# Patient Record
Sex: Female | Born: 1965 | Race: White | Hispanic: No | Marital: Single | Smoking: Former smoker
Health system: Southern US, Community
[De-identification: ages and names within clinical notes are randomized; demographics above are authoritative.]

## PROBLEM LIST (undated history)

## (undated) DIAGNOSIS — O24419 Gestational diabetes mellitus in pregnancy, unspecified control: Secondary | ICD-10-CM

## (undated) DIAGNOSIS — B019 Varicella without complication: Secondary | ICD-10-CM

## (undated) DIAGNOSIS — K219 Gastro-esophageal reflux disease without esophagitis: Secondary | ICD-10-CM

## (undated) HISTORY — DX: Gastro-esophageal reflux disease without esophagitis: K21.9

## (undated) HISTORY — DX: Varicella without complication: B01.9

## (undated) HISTORY — DX: Gestational diabetes mellitus in pregnancy, unspecified control: O24.419

---
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
## 2006-12-11 DIAGNOSIS — O24419 Gestational diabetes mellitus in pregnancy, unspecified control: Secondary | ICD-10-CM

## 2006-12-11 HISTORY — DX: Gestational diabetes mellitus in pregnancy, unspecified control: O24.419

## 2010-12-11 HISTORY — PX: TUBAL LIGATION: SHX77

## 2010-12-11 HISTORY — PX: LAPAROSCOPIC GASTRIC SLEEVE RESECTION: SHX5895

## 2013-10-29 ENCOUNTER — Telehealth: Payer: Self-pay | Admitting: Family Medicine

## 2013-10-29 NOTE — Telephone Encounter (Signed)
Pt is new to the area and is trying to est w/you as her PCP.  The first apptmt for new patient I could find is July 09, 2014.  Is this correct? Pt says she has no medical problems but would like to be established prior to that. If July 30th is correct, could you accommodate her a sooner appmt? Thank you.

## 2013-10-30 NOTE — Telephone Encounter (Signed)
I don't know if that is correct. I can accommodate her sooner... Add her in on a day when no other new pt is here for 30 min.

## 2013-11-12 NOTE — Telephone Encounter (Signed)
Pt scheduled for 12/12/2013

## 2013-12-12 ENCOUNTER — Ambulatory Visit (INDEPENDENT_AMBULATORY_CARE_PROVIDER_SITE_OTHER): Payer: BC Managed Care – PPO | Admitting: Family Medicine

## 2013-12-12 ENCOUNTER — Encounter: Payer: Self-pay | Admitting: Family Medicine

## 2013-12-12 VITALS — BP 126/70 | HR 80 | Temp 98.8°F | Ht 66.5 in | Wt 186.0 lb

## 2013-12-12 DIAGNOSIS — H9190 Unspecified hearing loss, unspecified ear: Secondary | ICD-10-CM | POA: Insufficient documentation

## 2013-12-12 DIAGNOSIS — N951 Menopausal and female climacteric states: Secondary | ICD-10-CM | POA: Insufficient documentation

## 2013-12-12 DIAGNOSIS — H9193 Unspecified hearing loss, bilateral: Secondary | ICD-10-CM

## 2013-12-12 DIAGNOSIS — K219 Gastro-esophageal reflux disease without esophagitis: Secondary | ICD-10-CM | POA: Insufficient documentation

## 2013-12-12 NOTE — Progress Notes (Signed)
   Subjective:    Patient ID: Elizabeth Doyle, female    DOB: [birth year], 48 y.o.   MRN: [id number]  HPI 48 year old female presents to establish care. Moved from [place] since 05/2013. Last saw PCP in [place] within the last year for CPX. OB GYN... 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  She has started having hot flashes, not lifes changing. Last menses was 3 months ago.  Diet:  Good, small meals after ggastric sleeve. Exercise: plans to restart ith trainer.     Review of Systems  Constitutional: Negative for fever, fatigue and unexpected weight change.  HENT: Positive for congestion. Negative for ear pain, sinus pressure, sneezing, sore throat and trouble swallowing.   Eyes: Negative for pain and itching.  Respiratory: Negative for cough, shortness of breath and wheezing.   Cardiovascular: Negative for chest pain, palpitations and leg swelling.  Gastrointestinal: Negative for nausea, abdominal pain, diarrhea, constipation and blood in stool.  Genitourinary: Negative for dysuria, hematuria, vaginal discharge, difficulty urinating and menstrual problem.  Skin: Negative for rash.  Neurological: Negative for syncope, weakness, light-headedness, numbness and headaches.  Psychiatric/Behavioral: Negative for confusion and dysphoric mood. The patient is not nervous/anxious.        Objective:   Physical Exam  Constitutional: Vital signs are normal. She appears well-developed and well-nourished. She is cooperative.  Non-toxic appearance. She does not appear ill. No distress.  HENT:  Head: Normocephalic.  Right Ear: Hearing, tympanic membrane, external ear and ear canal normal.  Left Ear: Hearing, tympanic membrane, external ear and ear canal normal.  Nose: Nose normal.  Eyes: Conjunctivae, EOM and lids are normal. Pupils  are equal, round, and reactive to light. Lids are everted and swept, no foreign bodies found.  Neck: Trachea normal and normal range of motion. Neck supple. Carotid bruit is not present. No mass and no thyromegaly present.  Cardiovascular: Normal rate, regular rhythm, S1 normal, S2 normal, normal heart sounds and intact distal pulses.  Exam reveals no gallop.   No murmur heard. Pulmonary/Chest: Effort normal and breath sounds normal. No respiratory distress. She has no wheezes. She has no rhonchi. She has no rales.  Abdominal: Soft. Normal appearance and bowel sounds are normal. She exhibits no distension, no fluid wave, no abdominal bruit and no mass. There is no hepatosplenomegaly. There is no tenderness. There is no rebound, no guarding and no CVA tenderness. No hernia.  Lymphadenopathy:    She has no cervical adenopathy.    She has no axillary adenopathy.  Neurological: She is alert. She has normal strength. No cranial nerve deficit or sensory deficit.  Skin: Skin is warm, dry and intact. No rash noted.  Psychiatric: Her speech is normal and behavior is normal. Judgment normal. Her mood appears not anxious. Cognition and memory are normal. She does not exhibit a depressed mood.          Assessment & Plan:

## 2013-12-12 NOTE — Assessment & Plan Note (Signed)
Well controlled on prilosec when she uses .[facility] Kitchen. She will start daily low dose. now that she knows she can.

## 2013-12-12 NOTE — Patient Instructions (Addendum)
Schedule you CPX  in 2-3 months with fasting labs prior. Schedule mammogram on your own.  Can try trial of black cohosh for menopausal symptoms.

## 2013-12-12 NOTE — Assessment & Plan Note (Signed)
Discussed options for treatment. She will first start with trial of black cohosh.

## 2013-12-18 ENCOUNTER — Other Ambulatory Visit: Payer: Self-pay | Admitting: Family Medicine

## 2013-12-18 ENCOUNTER — Other Ambulatory Visit: Payer: Self-pay

## 2013-12-18 DIAGNOSIS — Z1231 Encounter for screening mammogram for malignant neoplasm of breast: Secondary | ICD-10-CM

## 2013-12-18 DIAGNOSIS — N63 Unspecified lump in unspecified breast: Secondary | ICD-10-CM

## 2013-12-23 ENCOUNTER — Other Ambulatory Visit: Payer: Self-pay

## 2013-12-23 ENCOUNTER — Other Ambulatory Visit: Payer: Self-pay | Admitting: Family Medicine

## 2013-12-23 DIAGNOSIS — N63 Unspecified lump in unspecified breast: Secondary | ICD-10-CM

## 2013-12-26 ENCOUNTER — Ambulatory Visit
Admission: RE | Admit: 2013-12-26 | Discharge: 2013-12-26 | Disposition: A | Discharge status: Home / Self Care | Payer: BC Managed Care – PPO | Source: Ambulatory Visit | Attending: Family Medicine | Admitting: Family Medicine

## 2013-12-26 DIAGNOSIS — N63 Unspecified lump in unspecified breast: Secondary | ICD-10-CM

## 2014-03-01 ENCOUNTER — Telehealth: Payer: Self-pay | Admitting: Family Medicine

## 2014-03-01 DIAGNOSIS — Z13220 Encounter for screening for lipoid disorders: Secondary | ICD-10-CM

## 2014-03-01 NOTE — Telephone Encounter (Signed)
Message copied by Excell SeltzerBEDSOLE, AMY E on Sun Mar 01, 2014 10:57 PM ------      Message from: Mills KollerWALSH, TERRI J      Created: Mon Feb 23, 2014 10:06 AM      Regarding: Lab orders for Monday, 3.23.15       Patient is scheduled for CPX labs, please order future labs, Thanks , Terri       ------

## 2014-03-02 ENCOUNTER — Other Ambulatory Visit: Payer: BC Managed Care – PPO

## 2014-03-06 ENCOUNTER — Encounter: Payer: BC Managed Care – PPO | Admitting: Family Medicine

## 2014-05-01 ENCOUNTER — Other Ambulatory Visit: Payer: BC Managed Care – PPO

## 2014-05-08 ENCOUNTER — Encounter: Payer: BC Managed Care – PPO | Admitting: Family Medicine

## 2014-07-12 ENCOUNTER — Telehealth: Payer: Self-pay | Admitting: Family Medicine

## 2014-07-12 DIAGNOSIS — Z13220 Encounter for screening for lipoid disorders: Secondary | ICD-10-CM

## 2014-07-12 NOTE — Telephone Encounter (Signed)
Message copied by Excell SeltzerBEDSOLE, Khali Albanese E on Sun Jul 12, 2014 10:59 PM ------      Message from: Alvina ChouWALSH, TERRI J      Created: Tue Jul 07, 2014 12:19 PM      Regarding: Lab orders for Monday,8.3.15        Patient is scheduled for CPX labs, please order future labs, Thanks , Terri            cmp ordered for future, lipid is too, but has expired ------

## 2014-07-13 ENCOUNTER — Other Ambulatory Visit (INDEPENDENT_AMBULATORY_CARE_PROVIDER_SITE_OTHER): Payer: BC Managed Care – PPO

## 2014-07-13 DIAGNOSIS — Z13220 Encounter for screening for lipoid disorders: Secondary | ICD-10-CM

## 2014-07-13 LAB — COMPREHENSIVE METABOLIC PANEL
ALT: 19 U/L (ref 0–35)
AST: 22 U/L (ref 0–37)
Albumin: 3.5 g/dL (ref 3.5–5.2)
Alkaline Phosphatase: 56 U/L (ref 39–117)
BILIRUBIN TOTAL: 0.4 mg/dL (ref 0.2–1.2)
BUN: 12 mg/dL (ref 6–23)
CALCIUM: 8.6 mg/dL (ref 8.4–10.5)
CHLORIDE: 105 meq/L (ref 96–112)
CO2: 29 mEq/L (ref 19–32)
Creatinine, Ser: 0.6 mg/dL (ref 0.4–1.2)
GFR: 106.56 mL/min (ref >60.00)
Glucose, Bld: 95 mg/dL (ref 70–99)
Potassium: 4.1 mEq/L (ref 3.5–5.1)
Sodium: 138 mEq/L (ref 135–145)
Total Protein: 6.4 g/dL (ref 6.0–8.3)

## 2014-07-13 LAB — LIPID PANEL
CHOL/HDL RATIO: 3
Cholesterol: 191 mg/dL (ref 0–200)
HDL: 61.7 mg/dL (ref >39.00)
LDL CALC: 109 mg/dL — AB (ref 0–99)
NONHDL: 129.3
TRIGLYCERIDES: 103 mg/dL (ref 0.0–149.0)
VLDL: 20.6 mg/dL (ref 0.0–40.0)

## 2014-07-27 ENCOUNTER — Telehealth: Payer: Self-pay | Admitting: Family Medicine

## 2014-07-27 NOTE — Telephone Encounter (Signed)
Patient was scheduled to have her physical tomorrow, but she was called out of town for work.  Patient rescheduled her appointment to your next available appointment on 10/13/14.  Patient already had her lab work done. She was disappointed that she has to wait so long to have her physical.  Can she be seen sooner or can she be notified of her lab results?

## 2014-07-28 ENCOUNTER — Encounter: Payer: BC Managed Care – PPO | Admitting: Family Medicine

## 2014-07-28 NOTE — Telephone Encounter (Signed)
Get her in sooner in any 30 min slot

## 2014-07-31 ENCOUNTER — Ambulatory Visit (INDEPENDENT_AMBULATORY_CARE_PROVIDER_SITE_OTHER): Payer: BC Managed Care – PPO | Admitting: Family Medicine

## 2014-07-31 ENCOUNTER — Other Ambulatory Visit (HOSPITAL_COMMUNITY)
Admission: RE | Admit: 2014-07-31 | Discharge: 2014-07-31 | Disposition: A | Discharge status: Home / Self Care | Payer: BC Managed Care – PPO | Source: Ambulatory Visit | Attending: Family Medicine | Admitting: Family Medicine

## 2014-07-31 ENCOUNTER — Encounter: Payer: Self-pay | Admitting: Family Medicine

## 2014-07-31 ENCOUNTER — Encounter (INDEPENDENT_AMBULATORY_CARE_PROVIDER_SITE_OTHER): Payer: Self-pay

## 2014-07-31 VITALS — BP 116/70 | HR 57 | Temp 97.8°F | Ht 66.61 in | Wt 207.5 lb

## 2014-07-31 DIAGNOSIS — Z23 Encounter for immunization: Secondary | ICD-10-CM

## 2014-07-31 DIAGNOSIS — Z1151 Encounter for screening for human papillomavirus (HPV): Secondary | ICD-10-CM | POA: Insufficient documentation

## 2014-07-31 DIAGNOSIS — K219 Gastro-esophageal reflux disease without esophagitis: Secondary | ICD-10-CM

## 2014-07-31 DIAGNOSIS — Z01419 Encounter for gynecological examination (general) (routine) without abnormal findings: Secondary | ICD-10-CM | POA: Insufficient documentation

## 2014-07-31 DIAGNOSIS — Z0000 Encounter for general adult medical examination without abnormal findings: Secondary | ICD-10-CM

## 2014-07-31 DIAGNOSIS — Z124 Encounter for screening for malignant neoplasm of cervix: Secondary | ICD-10-CM

## 2014-07-31 MED ORDER — PANTOPRAZOLE SODIUM 20 MG PO TBEC: 20.0000 mg | DELAYED_RELEASE_TABLET | Freq: Every day | ORAL | Status: DC

## 2014-07-31 NOTE — Progress Notes (Signed)
Subjective:    Patient ID: Elizabeth Doyle, female    DOB: March 04, 1966, 48 y.o.   MRN: [id number]  HPI  The patient is here for annual wellness exam and preventative care.    Lab Results  Component Value Date   CHOL 191 07/13/2014   HDL 61.70 07/13/2014   LDLCALC 109* 07/13/2014   TRIG 103.0 07/13/2014   CHOLHDL 3 07/13/2014   CMET nml as well.  Black cohosh has helped with hot flashes.  GERD, prilosec 20 mg has help if she she takes daily.  She has noted a lot of  weight gain.  She is status post gastric sleeve. In past SE to nexium.. Gas.  Wt Readings from Last 3 Encounters:  07/31/14 207 lb 8 oz (94.121 kg)  12/12/13 186 lb (84.369 kg)   She reports healthy eating. No exercise.  She travels a lot.    Review of Systems  Constitutional: Negative for fever, fatigue and unexpected weight change.  HENT: Negative for congestion, ear pain, sinus pressure, sneezing, sore throat and trouble swallowing.   Eyes: Negative for pain and itching.  Respiratory: Negative for cough, shortness of breath and wheezing.   Cardiovascular: Negative for chest pain, palpitations and leg swelling.  Gastrointestinal: Negative for nausea, abdominal pain, diarrhea, constipation and blood in stool.  Genitourinary: Negative for dysuria, hematuria, vaginal discharge, difficulty urinating and menstrual problem.  Skin: Negative for rash.  Neurological: Negative for syncope, weakness, light-headedness, numbness and headaches.  Psychiatric/Behavioral: Negative for confusion and dysphoric mood. The patient is not nervous/anxious.        Objective:   Physical Exam  Constitutional: Vital signs are normal. She appears well-developed and well-nourished. She is cooperative.  Non-toxic appearance. She does not appear ill. No distress.  HENT:  Head: Normocephalic.  Right Ear: Hearing, tympanic membrane, external ear and ear canal normal.  Left Ear: Hearing, tympanic membrane, external ear and ear canal normal.    Nose: Nose normal.  Eyes: Conjunctivae, EOM and lids are normal. Pupils are equal, round, and reactive to light. Lids are everted and swept, no foreign bodies found.  Neck: Trachea normal and normal range of motion. Neck supple. Carotid bruit is not present. No mass and no thyromegaly present.  Cardiovascular: Normal rate, regular rhythm, S1 normal, S2 normal, normal heart sounds and intact distal pulses.  Exam reveals no gallop.   No murmur heard. Pulmonary/Chest: Effort normal and breath sounds normal. No respiratory distress. She has no wheezes. She has no rhonchi. She has no rales.  Abdominal: Soft. Normal appearance and bowel sounds are normal. She exhibits no distension, no fluid wave, no abdominal bruit and no mass. There is no hepatosplenomegaly. There is no tenderness. There is no rebound, no guarding and no CVA tenderness. No hernia.  Genitourinary: Vagina normal and uterus normal. No breast swelling, tenderness, discharge or bleeding. Pelvic exam was performed with patient supine. There is no rash, tenderness or lesion on the right labia. There is no rash, tenderness or lesion on the left labia. Uterus is not enlarged and not tender. Cervix exhibits no motion tenderness, no discharge and no friability. Right adnexum displays no mass, no tenderness and no fullness. Left adnexum displays no mass, no tenderness and no fullness.  Lymphadenopathy:    She has no cervical adenopathy.    She has no axillary adenopathy.  Neurological: She is alert. She has normal strength. No cranial nerve deficit or sensory deficit.  Skin: Skin is warm, dry and intact. No rash  noted.  Psychiatric: Her speech is normal and behavior is normal. Judgment normal. Her mood appears not anxious. Cognition and memory are normal. She does not exhibit a depressed mood.          Assessment & Plan:  The patient's preventative maintenance and recommended screening tests for an annual wellness exam were reviewed in full  today. Brought up to date unless services declined.  Counselled on the importance of diet, exercise, and its role in overall health and mortality. The patient's FH and SH was reviewed, including their home life, tobacco status, and drug and alcohol status.   OB GYN in [place]... Last OV/pap smear 2013, nml paps smear history, due  now mammogram: stable cysts,12/2013. Colonoscopy 2012 nml...plan repeat in 10 years Nonsmoker  Vaccines: uptodate,  Will get flu vaccine today

## 2014-07-31 NOTE — Addendum Note (Signed)
Addended by: Damita LackLORING, Catrell Morrone S on: 07/31/2014 12:37 PM   Modules accepted: Orders

## 2014-07-31 NOTE — Assessment & Plan Note (Signed)
SE to prilosec but it helps a lot with GERD.  Stop prilosec and change to pantoprazole.

## 2014-07-31 NOTE — Patient Instructions (Addendum)
Work on regular exercise routine 3-5 times a week.  Work on [employer]healthy eating, increase water.  Stop prilosec and change to pantoprazole.        Gastroesophageal Reflux Disease, Adult Gastroesophageal reflux disease (GERD) happens when acid from your stomach flows up into the esophagus. When acid comes in contact with the esophagus, the acid causes soreness (inflammation) in the esophagus. Over time, GERD may create small holes (ulcers) in the lining of the esophagus. CAUSES   Increased body weight. This puts pressure on the stomach, making acid rise from the stomach into the esophagus.  Smoking. This increases acid production in the stomach.  Drinking alcohol. This causes decreased pressure in the lower esophageal sphincter (valve or ring of muscle between the esophagus and stomach), allowing acid from the stomach into the esophagus.  Late evening meals and a full stomach. This increases pressure and acid production in the stomach.  A malformed lower esophageal sphincter. Sometimes, no cause is found. SYMPTOMS   Burning pain in the lower part of the mid-chest behind the breastbone and in the mid-stomach area. This may occur twice a week or more often.  Trouble swallowing.  Sore throat.  Dry cough.  Asthma-like symptoms including chest tightness, shortness of breath, or wheezing. DIAGNOSIS  Your caregiver may be able to diagnose GERD based on your symptoms. In some cases, X-rays and other tests may be done to check for complications or to check the condition of your stomach and esophagus. TREATMENT  Your caregiver may recommend over-the-counter or prescription medicines to help decrease acid production. Ask your caregiver before starting or adding any new medicines.  HOME CARE INSTRUCTIONS   Change the factors that you can control. Ask your caregiver for guidance concerning weight loss, quitting smoking, and alcohol consumption.  Avoid foods and drinks that make your symptoms  worse, such as:  Caffeine or alcoholic drinks.  Chocolate.  Peppermint or mint flavorings.  Garlic and onions.  Spicy foods.  Citrus fruits, such as oranges, lemons, or limes.  Tomato-based foods such as sauce, chili, salsa, and pizza.  Fried and fatty foods.  Avoid lying down for the 3 hours prior to your bedtime or prior to taking a nap.  Eat small, frequent meals instead of large meals.  Wear loose-fitting clothing. Do not wear anything tight around your waist that causes pressure on your stomach.  Raise the head of your bed 6 to 8 inches with wood blocks to help you sleep. Extra pillows will not help.  Only take over-the-counter or prescription medicines for pain, discomfort, or fever as directed by your caregiver.  Do not take aspirin, ibuprofen, or other nonsteroidal anti-inflammatory drugs (NSAIDs). SEEK IMMEDIATE MEDICAL CARE IF:   You have pain in your arms, neck, jaw, teeth, or back.  Your pain increases or changes in intensity or duration.  You develop nausea, vomiting, or sweating (diaphoresis).  You develop shortness of breath, or you faint.  Your vomit is green, yellow, black, or looks like coffee grounds or blood.  Your stool is red, bloody, or black. These symptoms could be signs of other problems, such as heart disease, gastric bleeding, or esophageal bleeding. MAKE SURE YOU:   Understand these instructions.  Will watch your condition.  Will get help right away if you are not doing well or get worse. Document Released: 09/06/2005 Document Revised: 02/19/2012 Document Reviewed: 06/16/2011 [facility] Patient Information 2015 [place], [place]. This information is not intended to replace advice given to you by your health care  provider. Make sure you discuss any questions you have with your health care provider.

## 2014-07-31 NOTE — Progress Notes (Signed)
Pre visit review using our clinic review tool, if applicable. No additional management support is needed unless otherwise documented below in the visit note. 

## 2014-08-03 LAB — CYTOLOGY - PAP

## 2014-08-04 ENCOUNTER — Encounter: Payer: Self-pay | Admitting: *Deleted

## 2014-10-13 ENCOUNTER — Encounter: Payer: BC Managed Care – PPO | Admitting: Family Medicine

## 2014-11-17 ENCOUNTER — Ambulatory Visit (INDEPENDENT_AMBULATORY_CARE_PROVIDER_SITE_OTHER): Payer: BC Managed Care – PPO | Admitting: Internal Medicine

## 2014-11-17 ENCOUNTER — Encounter: Payer: Self-pay | Admitting: Internal Medicine

## 2014-11-17 VITALS — BP 122/78 | HR 71 | Temp 98.1°F | Wt 204.0 lb

## 2014-11-17 DIAGNOSIS — B9789 Other viral agents as the cause of diseases classified elsewhere: Principal | ICD-10-CM

## 2014-11-17 DIAGNOSIS — J069 Acute upper respiratory infection, unspecified: Secondary | ICD-10-CM

## 2014-11-17 MED ORDER — HYDROCODONE-HOMATROPINE 5-1.5 MG/5ML PO SYRP: 5.0000 mL | ORAL_SOLUTION | Freq: Three times a day (TID) | ORAL | Status: DC | PRN

## 2014-11-17 NOTE — Progress Notes (Signed)
HPI  Pt presents to the clinic today with c/o nasal congestion, cough and chest congestion. She reports this started 4 days ago. She is blowing light green mucous out of her nose. The cough is unproductive. She denies fever, chills or body aches. She has tried Robitussin with some relief. She reports that she is feeling a little better today. She has a history of seasonal allergies but denies breathing problems. She has not had sick contacts that she is aware of.  Review of Systems      Past Medical History  Diagnosis Date  . Chicken pox 1972  . GERD (gastroesophageal reflux disease)   . DM (diabetes mellitus), gestational 2008    Family History  Problem Relation Age of Onset  . Hypertension Mother   . Arthritis Mother     osteoarthrits, scoliosis  . Kidney disease Mother     secondary to NSAIDs, HTN  . Hypertension Father   . Diabetes Father   . Diabetes Paternal Uncle   . Heart disease Maternal Grandmother   . Stroke Maternal Grandmother   . Mental illness Paternal Grandmother   . Cancer Paternal Grandfather     History   Social History  . Marital Status: Single    Spouse Name: N/A    Number of Children: N/A  . Years of Education: N/A   Occupational History  . Not on file.   Social History Main Topics  . Smoking status: Former Smoker -- 0.25 packs/day for 4 years    Quit date: 12/12/1988  . Smokeless tobacco: Never Used  . Alcohol Use: 1.0 oz/week    2 drink(s) per week  . Drug Use: No  . Sexual Activity:    Partners: Male    Birth Control/ Protection: Surgical   Other Topics Concern  . Not on file   Social History Narrative   In longterm relationship    works as Research scientist (medical)consultant    No Known Allergies   Constitutional:  Denies headache, fatigue, fever or abrupt weight changes.  HEENT:  Positive nasal congestion, sore throat. Denies eye redness, eye pain, pressure behind the eyes, facial pain, ear pain, ringing in the ears, wax buildup, runny nose or bloody  nose. Respiratory: Positive cough. Denies difficulty breathing or shortness of breath.  Cardiovascular: Denies chest pain, chest tightness, palpitations or swelling in the hands or feet.   No other specific complaints in a complete review of systems (except as listed in HPI above).  Objective:   BP 122/78 mmHg  Pulse 71  Temp(Src) 98.1 F (36.7 C) (Oral)  Wt 204 lb (92.534 kg)  SpO2 99% Wt Readings from Last 3 Encounters:  11/17/14 204 lb (92.534 kg)  07/31/14 207 lb 8 oz (94.121 kg)  12/12/13 186 lb (84.369 kg)     General: Appears her stated age, well developed, well nourished in NAD. HEENT: Head: normal shape and size, no sinus tenderness noted; Ears: Tm's gray and intact, normal light reflex; Nose: mucosa pink and moist, septum midline; Throat/Mouth: + PND. Teeth present, mucosa erythematous and moist, no exudate noted, no lesions or ulcerations noted. Cervical adenopathy noted.  Cardiovascular: Normal rate and rhythm. S1,S2 noted.  No murmur, rubs or gallops noted.  Pulmonary/Chest: Normal effort and positive vesicular breath sounds. No respiratory distress. No wheezes, rales or ronchi noted.      Assessment & Plan:   Viral URI with Cough:  Get some rest and drink plenty of water Do salt water gargles/Ibuprofen for the sore throat Pick  up some Mucinex at the pharmacy Rx for Hycodan cough syrup  If symptoms persist or worsen, call back Thursday or Friday and will call in a zpack  RTC as needed.

## 2014-11-17 NOTE — Patient Instructions (Signed)
Upper Respiratory Infection, Adult An upper respiratory infection (URI) is also sometimes known as the common cold. The upper respiratory tract includes the nose, sinuses, throat, trachea, and bronchi. Bronchi are the airways leading to the lungs. Most people improve within 1 week, but symptoms can last up to 2 weeks. A residual cough may last even longer.  CAUSES Many different viruses can infect the tissues lining the upper respiratory tract. The tissues become irritated and inflamed and often become very moist. Mucus production is also common. A cold is contagious. You can easily spread the virus to others by oral contact. This includes kissing, sharing a glass, coughing, or sneezing. Touching your mouth or nose and then touching a surface, which is then touched by another person, can also spread the virus. SYMPTOMS  Symptoms typically develop 1 to 3 days after you come in contact with a cold virus. Symptoms vary from person to person. They may include:  Runny nose.  Sneezing.  Nasal congestion.  Sinus irritation.  Sore throat.  Loss of voice (laryngitis).  Cough.  Fatigue.  Muscle aches.  Loss of appetite.  Headache.  Low-grade fever. DIAGNOSIS  You might diagnose your own cold based on familiar symptoms, since most people get a cold 2 to 3 times a year. Your caregiver can confirm this based on your exam. Most importantly, your caregiver can check that your symptoms are not due to another disease such as strep throat, sinusitis, pneumonia, asthma, or epiglottitis. Blood tests, throat tests, and X-rays are not necessary to diagnose a common cold, but they may sometimes be helpful in excluding other more serious diseases. Your caregiver will decide if any further tests are required. RISKS AND COMPLICATIONS  You may be at risk for a more severe case of the common cold if you smoke cigarettes, have chronic heart disease (such as heart failure) or lung disease (such as asthma), or if  you have a weakened immune system. The very young and very old are also at risk for more serious infections. Bacterial sinusitis, middle ear infections, and bacterial pneumonia can complicate the common cold. The common cold can worsen asthma and chronic obstructive pulmonary disease (COPD). Sometimes, these complications can require emergency medical care and may be life-threatening. PREVENTION  The best way to protect against getting a cold is to practice good hygiene. Avoid oral or hand contact with people with cold symptoms. Wash your hands often if contact occurs. There is no clear evidence that vitamin C, vitamin E, echinacea, or exercise reduces the chance of developing a cold. However, it is always recommended to get plenty of rest and practice good nutrition. TREATMENT  Treatment is directed at relieving symptoms. There is no cure. Antibiotics are not effective, because the infection is caused by a virus, not by bacteria. Treatment may include:  Increased fluid intake. Sports drinks offer valuable electrolytes, sugars, and fluids.  Breathing heated mist or steam (vaporizer or shower).  Eating chicken soup or other clear broths, and maintaining good nutrition.  Getting plenty of rest.  Using gargles or lozenges for comfort.  Controlling fevers with ibuprofen or acetaminophen as directed by your caregiver.  Increasing usage of your inhaler if you have asthma. Zinc gel and zinc lozenges, taken in the first 24 hours of the common cold, can shorten the duration and lessen the severity of symptoms. Pain medicines may help with fever, muscle aches, and throat pain. A variety of non-prescription medicines are available to treat congestion and runny nose. Your caregiver   can make recommendations and may suggest nasal or lung inhalers for other symptoms.  HOME CARE INSTRUCTIONS   Only take over-the-counter or prescription medicines for pain, discomfort, or fever as directed by your  caregiver.  Use a warm mist humidifier or inhale steam from a shower to increase air moisture. This may keep secretions moist and make it easier to breathe.  Drink enough water and fluids to keep your urine clear or pale yellow.  Rest as needed.  Return to work when your temperature has returned to normal or as your caregiver advises. You may need to stay home longer to avoid infecting others. You can also use a face mask and careful hand washing to prevent spread of the virus. SEEK MEDICAL CARE IF:   After the first few days, you feel you are getting worse rather than better.  You need your caregiver's advice about medicines to control symptoms.  You develop chills, worsening shortness of breath, or brown or red sputum. These may be signs of pneumonia.  You develop yellow or brown nasal discharge or pain in the face, especially when you bend forward. These may be signs of sinusitis.  You develop a fever, swollen neck glands, pain with swallowing, or white areas in the back of your throat. These may be signs of strep throat. SEEK IMMEDIATE MEDICAL CARE IF:   You have a fever.  You develop severe or persistent headache, ear pain, sinus pain, or chest pain.  You develop wheezing, a prolonged cough, cough up blood, or have a change in your usual mucus (if you have chronic lung disease).  You develop sore muscles or a stiff neck. Document Released: 05/23/2001 Document Revised: 02/19/2012 Document Reviewed: 03/04/2014 [employer] Patient Information 2015 [employer]. This information is not intended to replace advice given to you by your health care provider. Make sure you discuss any questions you have with your health care provider.  

## 2014-11-17 NOTE — Progress Notes (Signed)
Pre visit review using our clinic review tool, if applicable. No additional management support is needed unless otherwise documented below in the visit note. 

## 2014-12-08 ENCOUNTER — Other Ambulatory Visit: Payer: Self-pay

## 2014-12-08 DIAGNOSIS — Z1231 Encounter for screening mammogram for malignant neoplasm of breast: Secondary | ICD-10-CM

## 2014-12-29 ENCOUNTER — Ambulatory Visit: Payer: BC Managed Care – PPO

## 2015-03-30 ENCOUNTER — Ambulatory Visit
Admit: 2015-03-30 | Disposition: A | Discharge status: Home / Self Care | Payer: Self-pay | Attending: Otolaryngology | Admitting: Otolaryngology

## 2015-04-02 ENCOUNTER — Ambulatory Visit
Admission: RE | Admit: 2015-04-02 | Discharge: 2015-04-02 | Disposition: A | Discharge status: Home / Self Care | Payer: BLUE CROSS/BLUE SHIELD | Source: Ambulatory Visit

## 2015-04-02 DIAGNOSIS — Z1231 Encounter for screening mammogram for malignant neoplasm of breast: Secondary | ICD-10-CM

## 2016-01-19 ENCOUNTER — Other Ambulatory Visit: Payer: Self-pay | Admitting: Family Medicine

## 2016-04-13 ENCOUNTER — Ambulatory Visit (INDEPENDENT_AMBULATORY_CARE_PROVIDER_SITE_OTHER): Payer: BLUE CROSS/BLUE SHIELD | Admitting: Family Medicine

## 2016-04-13 ENCOUNTER — Encounter: Payer: Self-pay | Admitting: Family Medicine

## 2016-04-13 VITALS — BP 110/68 | HR 85 | Temp 98.4°F | Ht 66.5 in | Wt 228.2 lb

## 2016-04-13 DIAGNOSIS — Z0000 Encounter for general adult medical examination without abnormal findings: Secondary | ICD-10-CM | POA: Diagnosis not present

## 2016-04-13 DIAGNOSIS — N941 Unspecified dyspareunia: Secondary | ICD-10-CM | POA: Diagnosis not present

## 2016-04-13 DIAGNOSIS — N951 Menopausal and female climacteric states: Secondary | ICD-10-CM | POA: Diagnosis not present

## 2016-04-13 DIAGNOSIS — K219 Gastro-esophageal reflux disease without esophagitis: Secondary | ICD-10-CM | POA: Diagnosis not present

## 2016-04-13 MED ORDER — PANTOPRAZOLE SODIUM 20 MG PO TBEC: 20.0000 mg | DELAYED_RELEASE_TABLET | Freq: Every day | ORAL | Status: DC

## 2016-04-13 NOTE — Progress Notes (Signed)
The patient is here for annual wellness exam and preventative care.   Due for re-eval of labs for chol and DM screen. Lab Results  Component Value Date   CHOL 191 07/13/2014   HDL 61.70 07/13/2014   LDLCALC 109* 07/13/2014   TRIG 103.0 07/13/2014   CHOLHDL 3 07/13/2014   GERD, pantoprazole 20 mg daily.  She is going through menopause. Still having hot flashes, but better control on black cohosh. LMP 06/2015.  She is status post gastric sleeve. Wt Readings from Last 3 Encounters:  04/13/16 228 lb 4 oz (103.534 kg)  11/17/14 204 lb (92.534 kg)  07/31/14 207 lb 8 oz (94.121 kg)  She reports  Fairly healthy eating, easier to overeat further from surgery. No exercise.   Social History /Family History/Past Medical History reviewed and updated if needed.  Review of Systems  Constitutional: Negative for fever, fatigue and unexpected weight change.  HENT: Negative for congestion, ear pain, sinus pressure, sneezing, sore throat and trouble swallowing.  Eyes: Negative for pain and itching.  Respiratory: Negative for cough, shortness of breath and wheezing.  Cardiovascular: Negative for chest pain, palpitations and leg swelling.  Gastrointestinal: Negative for nausea, abdominal pain, diarrhea, constipation and blood in stool.  Genitourinary: Negative for dysuria, hematuria, vaginal discharge, difficulty urinating and menstrual problem.  Skin: Negative for rash.  Neurological: Negative for syncope, weakness, light-headedness, numbness and headaches.  Psychiatric/Behavioral: Negative for confusion and dysphoric mood. The patient is not nervous/anxious.       Objective:   Physical Exam  Constitutional: Vital signs are normal. She appears well-developed and well-nourished. She is cooperative. Non-toxic appearance. She does not appear ill. No distress.  HENT:  Head: Normocephalic.  Right Ear: Hearing, tympanic membrane, external ear and ear canal normal.  Left Ear: Hearing,  tympanic membrane, external ear and ear canal normal.  Nose: Nose normal.  Eyes: Conjunctivae, EOM and lids are normal. Pupils are equal, round, and reactive to light. Lids are everted and swept, no foreign bodies found.  Neck: Trachea normal and normal range of motion. Neck supple. Carotid bruit is not present. No mass and no thyromegaly present.  Cardiovascular: Normal rate, regular rhythm, S1 normal, S2 normal, normal heart sounds and intact distal pulses. Exam reveals no gallop.  No murmur heard. Pulmonary/Chest: Effort normal and breath sounds normal. No respiratory distress. She has no wheezes. She has no rhonchi. She has no rales.  Abdominal: Soft. Normal appearance and bowel sounds are normal. She exhibits no distension, no fluid wave, no abdominal bruit and no mass. There is no hepatosplenomegaly. There is no tenderness. There is no rebound, no guarding and no CVA tenderness. No hernia.  Genitourinary: Vagina normal and uterus normal. No breast swelling, tenderness, discharge or bleeding. Pelvic exam was performed with patient supine. There is no rash, tenderness or lesion on the right labia. There is no rash, tenderness or lesion on the left labia. Uterus is not enlarged and not tender.  NO PAP PERFORMED.. Right adnexum displays no mass, no tenderness and no fullness. Left adnexum displays no mass, no tenderness and no fullness.  Lymphadenopathy:   She has no cervical adenopathy.   She has no axillary adenopathy.  Neurological: She is alert. She has normal strength. No cranial nerve deficit or sensory deficit.  Skin: Skin is warm, dry and intact. No rash noted.  Psychiatric: Her speech is normal and behavior is normal. Judgment normal. Her mood appears not anxious. Cognition and memory are normal. She does not exhibit  a depressed mood.      Assessment & Plan:  The patient's preventative maintenance and recommended screening tests for an annual wellness exam were reviewed in full  today. Brought up to date unless services declined.  Counselled on the importance of diet, exercise, and its role in overall health and mortality. The patient's FH and SH was reviewed, including their home life, tobacco status, and drug and alcohol status.   PAP/DVE: nml pap in 07/2014, neg HPV mammogram: q 6 months 03/2015 Colonoscopy 2014 nml...plan repeat in 10 years Nonsmoker Vaccines: uptodate STD screen: refused

## 2016-04-13 NOTE — Assessment & Plan Note (Addendum)
Stable on black cohosh.

## 2016-04-13 NOTE — Assessment & Plan Note (Signed)
Likely due to vaginal dryness and menopause.  Recommend lubricants.  Discussed other options. She will follow up as needed.

## 2016-04-13 NOTE — Assessment & Plan Note (Signed)
Refilled pantoprazole.

## 2016-04-13 NOTE — Progress Notes (Signed)
Pre visit review using our clinic review tool, if applicable. No additional management support is needed unless otherwise documented below in the visit note. 

## 2016-04-13 NOTE — Patient Instructions (Addendum)
Get back to healthy eating , low carb diet, exercise 3-5 times a week and weight loss. Schedule lab only visit for fasting labs in next few weeks. Schedule mammogram on own as planned. Can try vaginal lubricants for menopausal vaginal dryness.

## 2016-04-17 ENCOUNTER — Other Ambulatory Visit (INDEPENDENT_AMBULATORY_CARE_PROVIDER_SITE_OTHER): Payer: BLUE CROSS/BLUE SHIELD

## 2016-04-17 ENCOUNTER — Telehealth: Payer: Self-pay | Admitting: Family Medicine

## 2016-04-17 DIAGNOSIS — Z13220 Encounter for screening for lipoid disorders: Secondary | ICD-10-CM

## 2016-04-17 LAB — COMPREHENSIVE METABOLIC PANEL
ALBUMIN: 4 g/dL (ref 3.5–5.2)
ALT: 20 U/L (ref 0–35)
AST: 18 U/L (ref 0–37)
Alkaline Phosphatase: 65 U/L (ref 39–117)
BUN: 17 mg/dL (ref 6–23)
CALCIUM: 9.6 mg/dL (ref 8.4–10.5)
CHLORIDE: 103 meq/L (ref 96–112)
CO2: 30 mEq/L (ref 19–32)
Creatinine, Ser: 0.61 mg/dL (ref 0.40–1.20)
GFR: 109.82 mL/min (ref >60.00)
Glucose, Bld: 94 mg/dL (ref 70–99)
POTASSIUM: 4 meq/L (ref 3.5–5.1)
Sodium: 140 mEq/L (ref 135–145)
Total Bilirubin: 0.4 mg/dL (ref 0.2–1.2)
Total Protein: 7 g/dL (ref 6.0–8.3)

## 2016-04-17 LAB — LIPID PANEL
CHOLESTEROL: 209 mg/dL — AB (ref 0–200)
HDL: 60.3 mg/dL (ref >39.00)
LDL CALC: 119 mg/dL — AB (ref 0–99)
NonHDL: 149.08
TRIGLYCERIDES: 150 mg/dL — AB (ref 0.0–149.0)
Total CHOL/HDL Ratio: 3
VLDL: 30 mg/dL (ref 0.0–40.0)

## 2016-04-17 NOTE — Telephone Encounter (Signed)
-----   Message from Alvina Chouerri J Walsh sent at 04/17/2016  8:28 AM EDT ----- Regarding: Lab orders asap, thanks Patient is scheduled for CPX labs, please order future labs, Thanks , Camelia Engerri

## 2016-06-15 ENCOUNTER — Other Ambulatory Visit: Payer: Self-pay | Admitting: Family Medicine

## 2016-06-15 DIAGNOSIS — Z1231 Encounter for screening mammogram for malignant neoplasm of breast: Secondary | ICD-10-CM

## 2016-06-30 ENCOUNTER — Ambulatory Visit: Payer: Self-pay

## 2016-07-21 ENCOUNTER — Ambulatory Visit
Admission: RE | Admit: 2016-07-21 | Discharge: 2016-07-21 | Disposition: A | Discharge status: Home / Self Care | Payer: BLUE CROSS/BLUE SHIELD | Source: Ambulatory Visit | Attending: Family Medicine | Admitting: Family Medicine

## 2016-07-21 DIAGNOSIS — Z1231 Encounter for screening mammogram for malignant neoplasm of breast: Secondary | ICD-10-CM

## 2016-07-25 ENCOUNTER — Other Ambulatory Visit: Payer: Self-pay | Admitting: Family Medicine

## 2016-07-25 DIAGNOSIS — R928 Other abnormal and inconclusive findings on diagnostic imaging of breast: Secondary | ICD-10-CM

## 2016-07-28 ENCOUNTER — Ambulatory Visit
Admission: RE | Admit: 2016-07-28 | Discharge: 2016-07-28 | Disposition: A | Discharge status: Home / Self Care | Payer: BLUE CROSS/BLUE SHIELD | Source: Ambulatory Visit | Attending: Family Medicine | Admitting: Family Medicine

## 2016-07-28 DIAGNOSIS — R928 Other abnormal and inconclusive findings on diagnostic imaging of breast: Secondary | ICD-10-CM

## 2017-04-27 ENCOUNTER — Encounter: Payer: Self-pay | Admitting: Family Medicine

## 2017-04-27 ENCOUNTER — Ambulatory Visit (INDEPENDENT_AMBULATORY_CARE_PROVIDER_SITE_OTHER): Payer: BLUE CROSS/BLUE SHIELD | Admitting: Family Medicine

## 2017-04-27 VITALS — BP 107/70 | HR 65 | Temp 98.6°F | Ht 67.0 in | Wt 246.0 lb

## 2017-04-27 DIAGNOSIS — K219 Gastro-esophageal reflux disease without esophagitis: Secondary | ICD-10-CM

## 2017-04-27 DIAGNOSIS — Z13220 Encounter for screening for lipoid disorders: Secondary | ICD-10-CM

## 2017-04-27 DIAGNOSIS — Z0001 Encounter for general adult medical examination with abnormal findings: Secondary | ICD-10-CM

## 2017-04-27 DIAGNOSIS — N951 Menopausal and female climacteric states: Secondary | ICD-10-CM | POA: Diagnosis not present

## 2017-04-27 DIAGNOSIS — R635 Abnormal weight gain: Secondary | ICD-10-CM | POA: Diagnosis not present

## 2017-04-27 DIAGNOSIS — Z23 Encounter for immunization: Secondary | ICD-10-CM | POA: Diagnosis not present

## 2017-04-27 DIAGNOSIS — Z0000 Encounter for general adult medical examination without abnormal findings: Secondary | ICD-10-CM

## 2017-04-27 LAB — LIPID PANEL
CHOLESTEROL: 222 mg/dL — AB (ref 0–200)
HDL: 54.6 mg/dL (ref >39.00)
LDL CALC: 142 mg/dL — AB (ref 0–99)
NONHDL: 167.51
Total CHOL/HDL Ratio: 4
Triglycerides: 129 mg/dL (ref 0.0–149.0)
VLDL: 25.8 mg/dL (ref 0.0–40.0)

## 2017-04-27 LAB — COMPREHENSIVE METABOLIC PANEL
ALT: 22 U/L (ref 0–35)
AST: 18 U/L (ref 0–37)
Albumin: 4.1 g/dL (ref 3.5–5.2)
Alkaline Phosphatase: 74 U/L (ref 39–117)
BUN: 15 mg/dL (ref 6–23)
CHLORIDE: 106 meq/L (ref 96–112)
CO2: 29 mEq/L (ref 19–32)
Calcium: 9.4 mg/dL (ref 8.4–10.5)
Creatinine, Ser: 0.65 mg/dL (ref 0.40–1.20)
GFR: 101.65 mL/min (ref >60.00)
GLUCOSE: 102 mg/dL — AB (ref 70–99)
POTASSIUM: 4.6 meq/L (ref 3.5–5.1)
SODIUM: 141 meq/L (ref 135–145)
Total Bilirubin: 0.4 mg/dL (ref 0.2–1.2)
Total Protein: 7.2 g/dL (ref 6.0–8.3)

## 2017-04-27 LAB — TSH: TSH: 1.54 u[IU]/mL (ref 0.35–4.50)

## 2017-04-27 LAB — T3, FREE: T3, Free: 3.6 pg/mL (ref 2.3–4.2)

## 2017-04-27 LAB — T4, FREE: Free T4: 0.89 ng/dL (ref 0.60–1.60)

## 2017-04-27 NOTE — Progress Notes (Signed)
Subjective:    Patient ID: Elizabeth Doyle, female    DOB: [birth year], 51 y.o.   MRN: [id number]  HPI  The patient is here for annual wellness exam and preventative care.     She is s/p gastric sleeve.  Due for re-eval labs.   Diet: good. Exercise:  2 times a week with trainer and walks 2-3 days a week.  She has low back pain, hip and knees. Using ibuprofen off and on.   She has gained weight in last year.  She is not sure why she is gaining weight. Body mass index is 38.53 kg/m. Wt Readings from Last 3 Encounters:  04/27/17 246 lb (111.6 kg)  04/13/16 228 lb 4 oz (103.5 kg)  11/17/14 204 lb (92.5 kg)    GERD : stable control on pantoprazole   Social History /Family History/Past Medical History reviewed in detail and updated in EMR if needed. Blood pressure 107/70, pulse 65, temperature 98.6 F (37 C), temperature source Oral, height 5\' 7"  (1.702 m), weight 246 lb (111.6 kg), last menstrual period 06/13/2014.  Review of Systems  Constitutional: Positive for fatigue. Negative for fever.  HENT: Negative for congestion.   Eyes: Negative for pain.  Respiratory: Negative for cough and shortness of breath.   Cardiovascular: Negative for chest pain, palpitations and leg swelling.  Gastrointestinal: Negative for abdominal pain.  Endocrine: Negative for cold intolerance and heat intolerance.  Genitourinary: Negative for dysuria and vaginal bleeding.  Musculoskeletal: Negative for back pain.  Neurological: Negative for syncope, light-headedness and headaches.  Psychiatric/Behavioral: Negative for dysphoric mood.       Objective:   Physical Exam  Constitutional: Vital signs are normal. She appears well-developed and well-nourished. She is cooperative.  Non-toxic appearance. She does not appear ill. No distress.  Morbidly obese  HENT:  Head: Normocephalic.  Right Ear: Hearing, tympanic membrane, external ear and ear canal normal.  Left Ear: Hearing, tympanic membrane, external  ear and ear canal normal.  Nose: Nose normal.  Eyes: Conjunctivae, EOM and lids are normal. Pupils are equal, round, and reactive to light. Lids are everted and swept, no foreign bodies found.  Neck: Trachea normal and normal range of motion. Neck supple. Carotid bruit is not present. No thyroid mass and no thyromegaly present.  Cardiovascular: Normal rate, regular rhythm, S1 normal, S2 normal, normal heart sounds and intact distal pulses.  Exam reveals no gallop.   No murmur heard. Pulmonary/Chest: Effort normal and breath sounds normal. No respiratory distress. She has no wheezes. She has no rhonchi. She has no rales.  Abdominal: Soft. Normal appearance and bowel sounds are normal. She exhibits no distension, no fluid wave, no abdominal bruit and no mass. There is no hepatosplenomegaly. There is no tenderness. There is no rebound, no guarding and no CVA tenderness. No hernia.  Lymphadenopathy:    She has no cervical adenopathy.    She has no axillary adenopathy.  Neurological: She is alert. She has normal strength. No cranial nerve deficit or sensory deficit.  Skin: Skin is warm, dry and intact. No rash noted.  Psychiatric: Her speech is normal and behavior is normal. Judgment normal. Her mood appears not anxious. Cognition and memory are normal. She does not exhibit a depressed mood.          Assessment & Plan:  The patient's preventative maintenance and recommended screening tests for an annual wellness exam were reviewed in full today. Brought up to date unless services declined.  Counselled on the importance  of diet, exercise, and its role in overall health and mortality. The patient's FH and SH was reviewed, including their home life, tobacco status, and drug and alcohol status.   PAP/DVE: nml pap in 07/2014, neg HPV plan repeat in 2020 No family history of ovarian or endometrial cancer, asymptomatic.. Will do DVE every. mammogram: 07/2016.[facility]land Kitchen. Repeat in 1 year Colonoscopy 2014  nml...plan repeat in 10 years Nonsmoker Vaccines:  Due for tdap STD screen: refused

## 2017-04-27 NOTE — Assessment & Plan Note (Signed)
Stable control on PPI> 

## 2017-04-27 NOTE — Assessment & Plan Note (Signed)
Counseled on lifestyle changes. Discussed meds to treat. Consider victoza if FBS elevated.

## 2017-04-27 NOTE — Patient Instructions (Addendum)
Please stop at the lab to set up to have labs drawn. Consider water exercise.  Continue work ing on low carb low fat diet.  Call to schedule mammogram after 07/2017

## 2017-04-27 NOTE — Assessment & Plan Note (Signed)
Improved symptoms at this time.

## 2017-04-27 NOTE — Addendum Note (Signed)
Addended by: Damita LackLORING, Ezequias Lard S on: 04/27/2017 11:06 AM   Modules accepted: Orders

## 2017-04-30 ENCOUNTER — Encounter: Payer: Self-pay | Admitting: *Deleted

## 2017-05-02 ENCOUNTER — Encounter: Payer: Self-pay | Admitting: Family Medicine

## 2017-05-04 MED ORDER — LIRAGLUTIDE 18 MG/3ML ~~LOC~~ SOPN: 0.6000 mg | PEN_INJECTOR | Freq: Every day | SUBCUTANEOUS | 11 refills | Status: AC

## 2017-05-04 MED ORDER — PEN NEEDLES 30G X 5 MM MISC: 0.6000 mg | Freq: Every day | 11 refills | Status: AC

## 2017-06-04 ENCOUNTER — Other Ambulatory Visit: Payer: Self-pay | Admitting: Family Medicine

## 2017-06-08 ENCOUNTER — Ambulatory Visit (INDEPENDENT_AMBULATORY_CARE_PROVIDER_SITE_OTHER): Payer: BLUE CROSS/BLUE SHIELD | Admitting: Podiatry

## 2017-06-08 ENCOUNTER — Encounter: Payer: Self-pay | Admitting: Podiatry

## 2017-06-08 ENCOUNTER — Ambulatory Visit (INDEPENDENT_AMBULATORY_CARE_PROVIDER_SITE_OTHER): Payer: BLUE CROSS/BLUE SHIELD

## 2017-06-08 DIAGNOSIS — M779 Enthesopathy, unspecified: Secondary | ICD-10-CM | POA: Diagnosis not present

## 2017-06-08 DIAGNOSIS — R52 Pain, unspecified: Secondary | ICD-10-CM

## 2017-06-08 MED ORDER — TRIAMCINOLONE ACETONIDE 10 MG/ML IJ SUSP
10.0000 mg | Freq: Once | INTRAMUSCULAR | Status: AC
  Administered 2017-06-08: 10 mg

## 2017-06-08 NOTE — Progress Notes (Signed)
Subjective:    Patient ID: Elizabeth Doyle, female   DOB: 51 y.o.   MRN: [id number]   HPI patient presents stating I have had a lot of pain in the outside of my feet right over left and I did have plantar fasciitis for an I had gained weight and I'm trying to start losing    Review of Systems  All other systems reviewed and are negative.       Objective:  Physical Exam  Cardiovascular: Intact distal pulses.   Musculoskeletal: Normal range of motion.  Neurological: She is alert.  Skin: Skin is warm.  Nursing note and vitals reviewed.  neurovascular status intact muscle strength adequate range of motion within normal limits with exquisite discomfort in the lateral side of the right peroneal insertion with mild discomfort in the left. Patient has quite a bit of pain when palpated and states that it's gotten worse over the last month     Assessment:   Acute peroneal tendinitis right over left with inflammation fluid buildup      Plan:   H&P x-rays reviewed and careful sheath injection administered right 3 mg Kenalog 5 mill grams Xylocaine and instructed on ice therapy. Dispense fascial brace right to lift up the lateral side of the foot and patient be seen back again in the next several weeks to see results  X-rays indicate mild high arch foot type with no indications of bone pathology around fifth metatarsal base

## 2017-06-22 ENCOUNTER — Ambulatory Visit (INDEPENDENT_AMBULATORY_CARE_PROVIDER_SITE_OTHER): Payer: BLUE CROSS/BLUE SHIELD | Admitting: Podiatry

## 2017-06-22 DIAGNOSIS — M779 Enthesopathy, unspecified: Secondary | ICD-10-CM

## 2017-06-22 NOTE — Progress Notes (Signed)
   Subjective:    Patient ID: Elizabeth Doyle, female    DOB: November 04, 1966, 51 y.o.   MRN: [id number]  HPI  Chief Complaint  Patient presents with  . Tendonitis    Feels better, brace helping, swelling down       Review of Systems     Objective:   Physical Exam        Assessment & Plan:

## 2017-06-22 NOTE — Progress Notes (Signed)
Subjective:    Patient ID: Elizabeth DroneKristin Trani, female   DOB: 51 y.o.   MRN: [id number]   HPI patient states she's feeling quite a bit better with discomfort dorsal lateral left but improved from previous    ROS      Objective:  Physical Exam neurovascular status intact with patient still noted to have     Assessment:    Tendinitis that's improving with previous treatment     Plan:    Advised on continued ice therapy anti-inflammatories supportive shoes and patient will be seen back as needed

## 2017-08-20 ENCOUNTER — Other Ambulatory Visit: Payer: Self-pay | Admitting: Family Medicine

## 2017-08-20 DIAGNOSIS — Z1231 Encounter for screening mammogram for malignant neoplasm of breast: Secondary | ICD-10-CM

## 2017-09-07 ENCOUNTER — Ambulatory Visit
Admission: RE | Admit: 2017-09-07 | Discharge: 2017-09-07 | Disposition: A | Discharge status: Home / Self Care | Payer: BLUE CROSS/BLUE SHIELD | Source: Ambulatory Visit | Attending: Family Medicine | Admitting: Family Medicine

## 2017-09-07 DIAGNOSIS — Z1231 Encounter for screening mammogram for malignant neoplasm of breast: Secondary | ICD-10-CM

## 2017-10-15 ENCOUNTER — Other Ambulatory Visit: Payer: Self-pay | Admitting: *Deleted

## 2017-10-15 MED ORDER — PANTOPRAZOLE SODIUM 20 MG PO TBEC: 20.0000 mg | DELAYED_RELEASE_TABLET | Freq: Every day | ORAL | 1 refills | Status: AC

## 2017-11-27 ENCOUNTER — Encounter: Payer: Self-pay | Admitting: Family Medicine

## 2017-11-27 ENCOUNTER — Ambulatory Visit: Payer: Self-pay | Admitting: Family Medicine

## 2017-11-27 VITALS — BP 125/80 | HR 76 | Temp 98.0°F | Resp 12 | Ht 67.0 in | Wt 250.5 lb

## 2017-11-27 DIAGNOSIS — R300 Dysuria: Secondary | ICD-10-CM

## 2017-11-27 DIAGNOSIS — N390 Urinary tract infection, site not specified: Secondary | ICD-10-CM

## 2017-11-27 DIAGNOSIS — N941 Unspecified dyspareunia: Secondary | ICD-10-CM

## 2017-11-27 LAB — POCT URINALYSIS DIPSTICK
Bilirubin, UA: NEGATIVE
GLUCOSE UA: NEGATIVE
Ketones, UA: NEGATIVE
Nitrite, UA: NEGATIVE
ODOR: NEGATIVE
Protein, UA: NEGATIVE
Spec Grav, UA: 1.015 (ref 1.010–1.025)
Urobilinogen, UA: 0.2 E.U./dL
pH, UA: 6 (ref 5.0–8.0)

## 2017-11-27 MED ORDER — NITROFURANTOIN MONOHYD MACRO 100 MG PO CAPS: 100.0000 mg | ORAL_CAPSULE | Freq: Two times a day (BID) | ORAL | 0 refills | Status: AC

## 2017-11-27 NOTE — Progress Notes (Signed)
HPI:  Chief Complaint  Patient presents with  . Dysuria    started a few days ago, became worse on yesterday    [name] is a 51 y.o. female, who is here today complaining of "a few days" of urinary symptoms. She has Hx of DM II.  She denies fever, chills, or unusual fatigue. History of back pain, which she reports to be at her baseline.  Dysuria: Yes, burning sensation after urination. Urinary frequency: Yes. Having small amount of urine at the time. Urinary urgency: Yes Incontinence: Denies. Hematuria: Denies.  Abdominal pain: Denies   Nausea or vomiting: Denies  Abnormal vaginal bleeding or discharge: Denies  LMP: Post menopause. Vaginal dryness and dyspareunia, both she is having for "a while" and getting worse.  Sexual activity: Yes, no more than usual.  Hx of UTI: Denies  OTC medications for this problem: None   Review of Systems  Constitutional: Negative for activity change, appetite change, fatigue and fever.  Gastrointestinal: Negative for abdominal pain, nausea and vomiting.       No changes in bowel habits.  Genitourinary: Positive for dyspareunia, dysuria, frequency and urgency. Negative for decreased urine volume, hematuria, pelvic pain, vaginal bleeding and vaginal discharge.  Musculoskeletal: Positive for arthralgias and back pain. Negative for myalgias.       Hx of joint and back pain.   Neurological: Negative for syncope and weakness.  Psychiatric/Behavioral: Negative for confusion. The patient is not nervous/anxious.       Current Outpatient Medications on File Prior to Visit  Medication Sig Dispense Refill  . Insulin Pen Needle (PEN NEEDLES) 30G X 5 MM MISC 0.6 mg by Does not apply route daily. Fill with needled for use with victoza. 100 each 11  . liraglutide 18 MG/3ML SOPN Inject 0.1 mLs (0.6 mg total) into the skin daily. 3 mL 11  . loratadine (CLARITIN) 10 MG tablet Take 10 mg by mouth daily.    . Multiple  Vitamins-Minerals (MULTIVITAMIN GUMMIES ADULT PO) Take by mouth.    . pantoprazole (PROTONIX) 20 MG tablet Take 1 tablet (20 mg total) daily by mouth. 90 tablet 1   No current facility-administered medications on file prior to visit.      Past Medical History:  Diagnosis Date  . Chicken pox 1972  . DM (diabetes mellitus), gestational 2008  . GERD (gastroesophageal reflux disease)    No Known Allergies  Social History   Socioeconomic History  . Marital status: Married    Spouse name: None  . Number of children: None  . Years of education: None  . Highest education level: None  Social Needs  . Financial resource strain: None  . Food insecurity - worry: None  . Food insecurity - inability: None  . Transportation needs - medical: None  . Transportation needs - non-medical: None  Occupational History  . None  Tobacco Use  . Smoking status: Former Smoker    Packs/day: 0.25    Years: 4.00    Pack years: 1.00    Last attempt to quit: 12/12/1988    Years since quitting: 28.9  . Smokeless tobacco: Never Used  Substance and Sexual Activity  . Alcohol use: Yes    Alcohol/week: 1.0 oz    Types: 2 Standard drinks or equivalent per week  . Drug use: No  . Sexual activity: Yes    Partners: Male    Birth control/protection: Surgical  Other Topics Concern  . None  Social History Narrative  In longterm relationship    works as Research scientist (medical)consultant    Vitals:   11/27/17 1007  BP: 125/80  Pulse: 76  Resp: 12  Temp: 98 F (36.7 C)  SpO2: 96%   Body mass index is 39.23 kg/m.   Physical Exam  Nursing note and vitals reviewed. Constitutional: She is oriented to person, place, and time. She appears well-developed. No distress.  HENT:  Head: Normocephalic and atraumatic.  Mouth/Throat: Oropharynx is clear and moist and mucous membranes are normal.  Eyes: Conjunctivae are normal.  Cardiovascular: Normal rate and regular rhythm.  Respiratory: Effort normal and breath sounds normal.  No respiratory distress.  GI: Soft. She exhibits no mass. There is no tenderness. There is no CVA tenderness.  Genitourinary:  Genitourinary Comments: Deferred to gyn/PCP if not improvement.  Musculoskeletal: She exhibits no edema or tenderness.  Lymphadenopathy:    She has no cervical adenopathy.  Neurological: She is alert and oriented to person, place, and time.  Skin: Skin is warm. No erythema.  Psychiatric: She has a normal mood and affect.  Fairly groomed,good eye contact.    ASSESSMENT AND PLAN:   Ms. Elizabeth Doyle was seen today for dysuria.  Diagnoses and all orders for this visit:  Dysuria  Possible causes of reported urinary symptoms discussed. Urine dipstick abnormal.  -     POCT urinalysis dipstick -     Urine culture  Urinary tract infection without hematuria, site unspecified  Ucx ordered.  Empiric abx treatment started today and will be tailored according to Ucx results and susceptibility report.  Clearly instructed about warning signs. F/U if symptoms persist.  -     Urine culture -     nitrofurantoin, macrocrystal-monohydrate, (MACROBID) 100 MG capsule; Take 1 capsule (100 mg total) by mouth 2 (two) times daily for 5 days.  Dyspareunia in female  Most likely related top estrogen deficiency, other possible causes discussed. Recommend OTC [place] as needed. Follow with PCP, she may benefit for topical vaginal Estrogen therapy.    -Elizabeth Doyle was advised to return or notify a doctor immediately if symptoms worsen or persist or new concerns arise.       Elizabeth Billinger G. [place], MD  [facility]. [place] office.

## 2017-11-27 NOTE — Patient Instructions (Addendum)
  [name] I have seen you today for an acute visit.  A few things to remember from today's visit:   Dysuria - Plan: POCT urinalysis dipstick, Urine culture  Urinary tract infection without hematuria, site unspecified - Plan: Urine culture, nitrofurantoin, macrocrystal-monohydrate, (MACROBID) 100 MG capsule  Dyspareunia in female   Medications prescribed today are intended for short period of time and will not be refill upon request, a follow up appointment might be necessary to discuss continuation of of treatment if appropriate.     Adequate fluid intake, avoid holding urine for long hours, and over the counter Vit C OR cranberry capsules might help.  Today we will treat empirically with antibiotic, which we might need to change when urine culture comes back depending of bacteria susceptibility.  Seek immediate medical attention if severe abdominal pain, vomiting, fever/chills, or worsening symptoms. Follow up with PCP  if symptomatic are not any better after 2-3 days of antibiotic treatment.  In general please monitor for signs of worsening symptoms and seek immediate medical attention if any concerning.    I hope you get better soon!    [place] over the counter may help with pain with intercourse.

## 2017-11-28 LAB — URINE CULTURE
MICRO NUMBER: 81421786
SPECIMEN QUALITY: ADEQUATE

## 2017-11-30 ENCOUNTER — Encounter: Payer: Self-pay | Admitting: Family Medicine

## 2019-09-24 IMAGING — MG 2D DIGITAL SCREENING BILATERAL MAMMOGRAM WITH CAD AND ADJUNCT TO
8 of 12 series · 8 of 28 positions shown · non-contrast
Comparison: Previous exam(s).

CLINICAL DATA: Screening.

EXAM:
2D DIGITAL SCREENING BILATERAL MAMMOGRAM WITH CAD AND ADJUNCT TOMO

[L MLO]
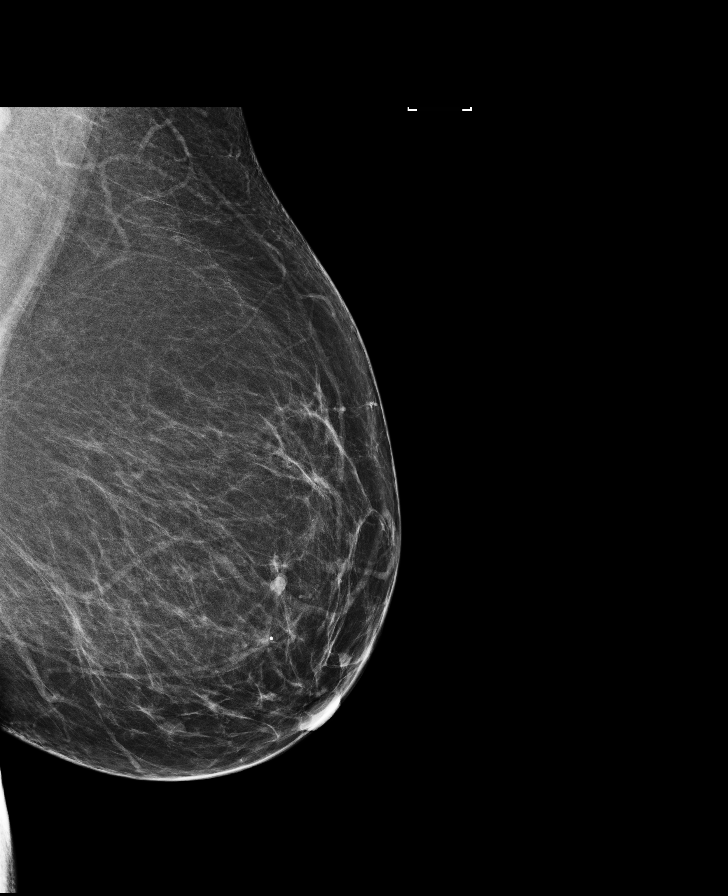

[L MLO synth-2D]
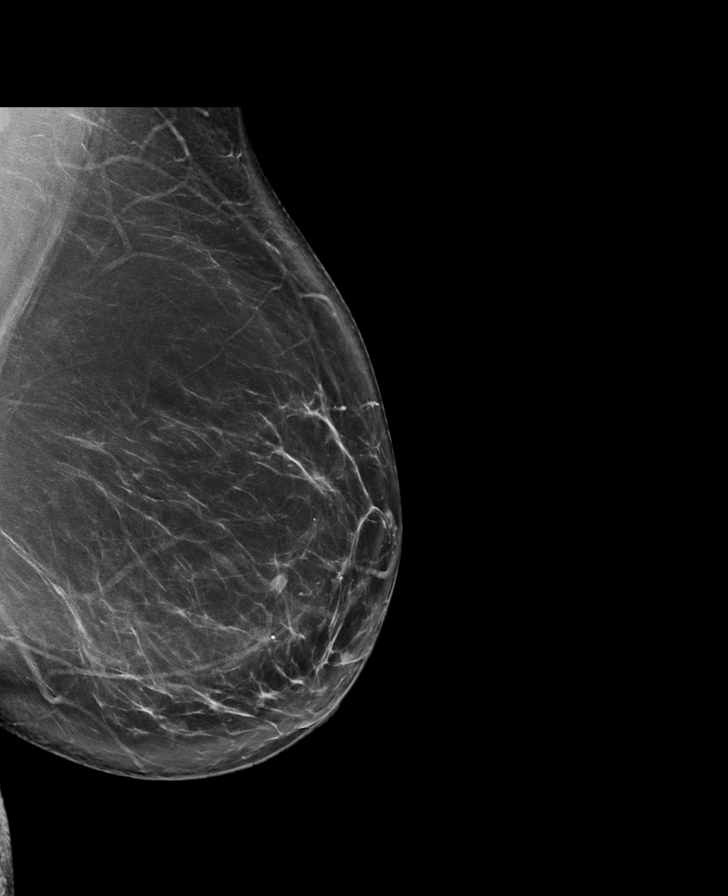

[L CC]
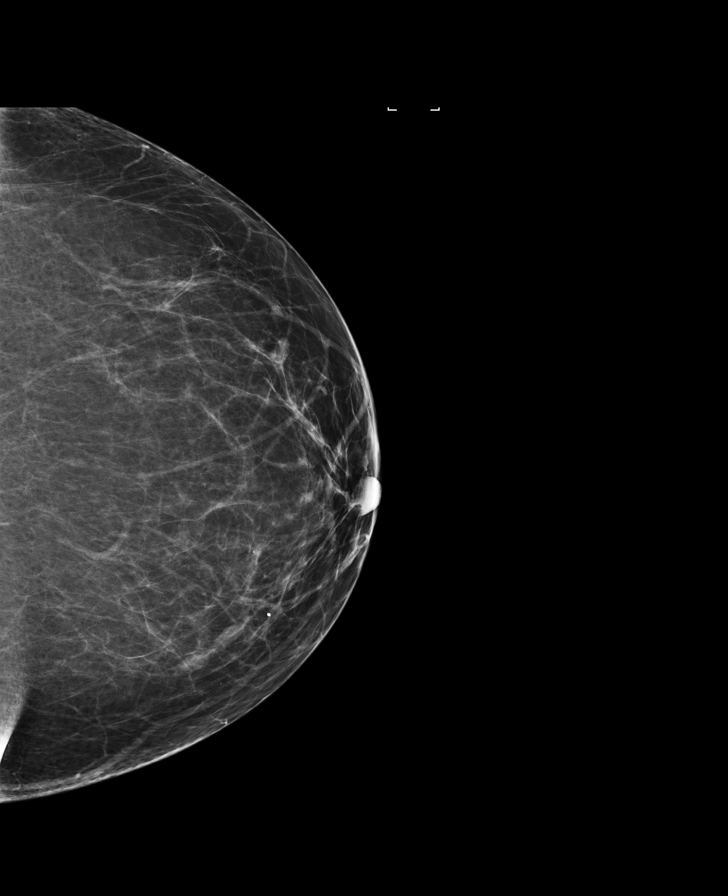

[R CC]
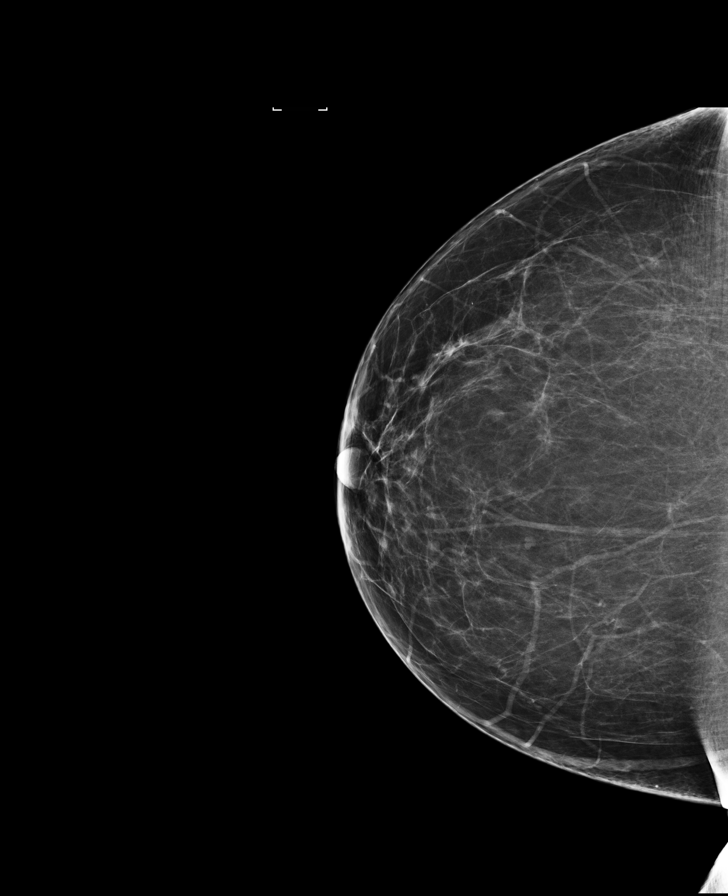

[R MLO synth-2D]
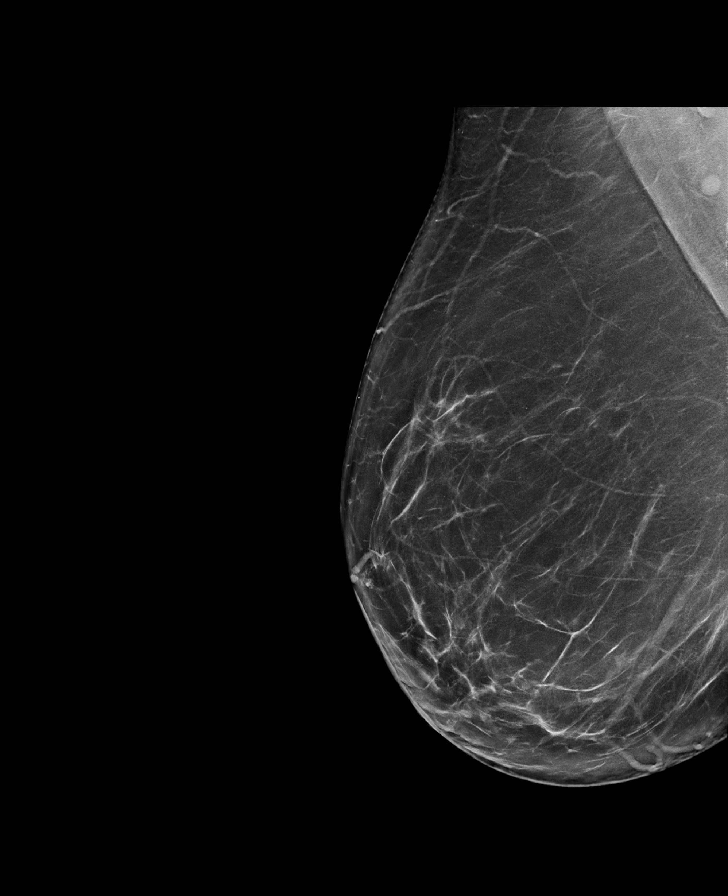

[R CC synth-2D]
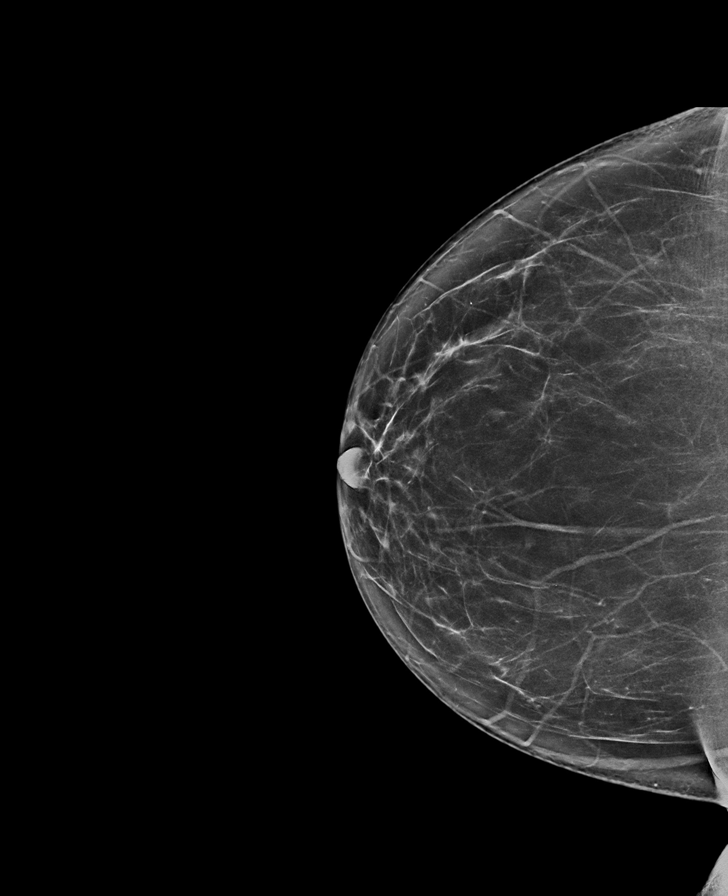

[L CC synth-2D]
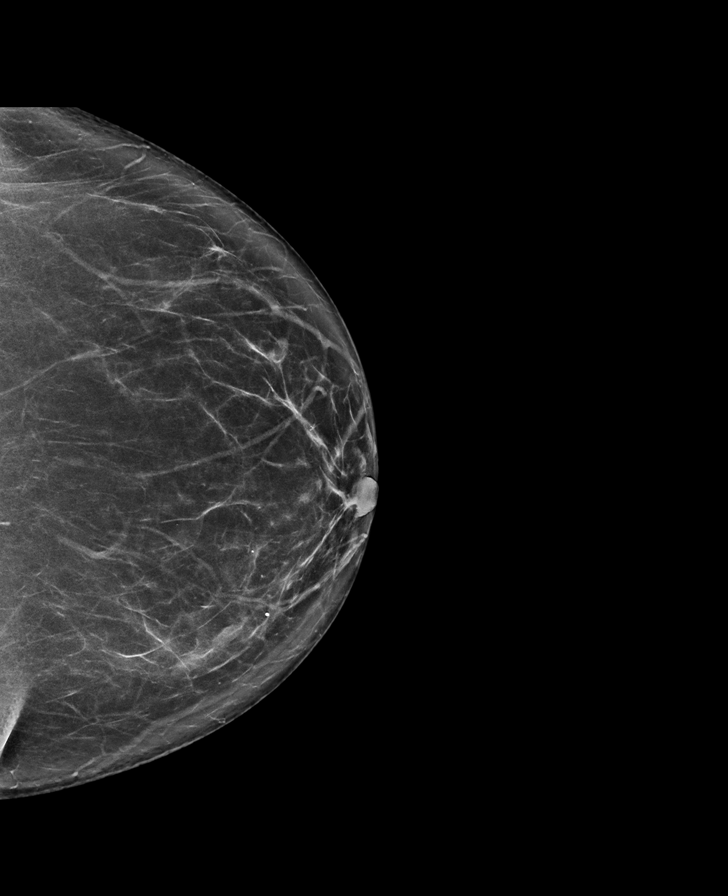

[R MLO]
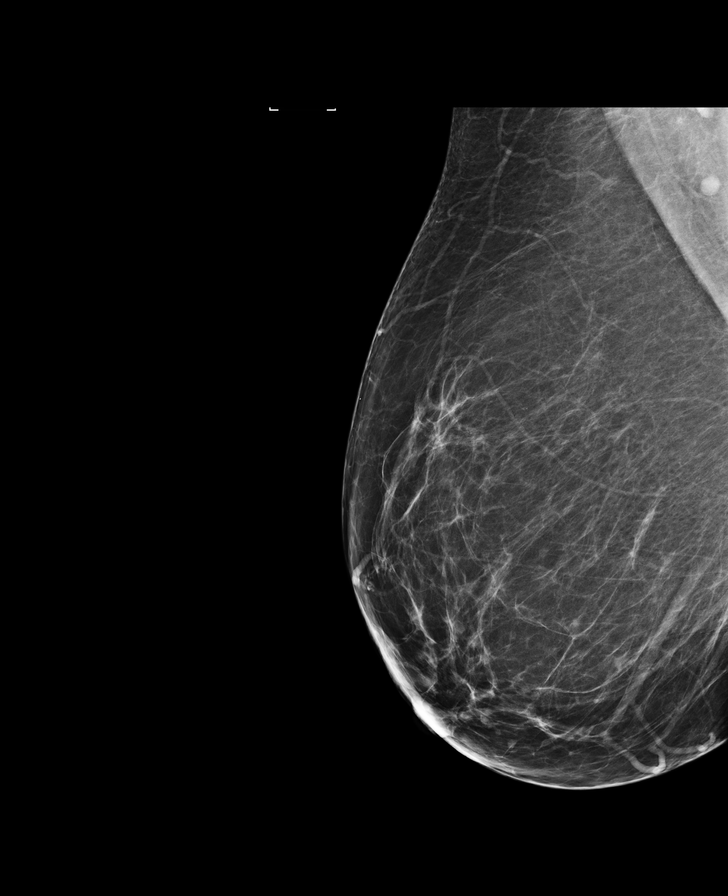

[8 of 28 positions shown; findings below may reference images not displayed]

ACR Breast Density Category b: There are scattered areas of
fibroglandular density.
FINDINGS: There are no findings suspicious for malignancy. Images were
processed with CAD.
IMPRESSION: No mammographic evidence of malignancy. A result letter of this
screening mammogram will be mailed directly to the patient.

RECOMMENDATION:
Screening mammogram in one year. (Code:97-6-RS4)

BI-RADS CATEGORY  1: Negative.
